# Patient Record
Sex: Male | Born: 1960 | Race: Black or African American | Hispanic: No | Marital: Single | Smoking: Current every day smoker
Health system: Southern US, Community
[De-identification: ages and names within clinical notes are randomized; demographics above are authoritative.]

## PROBLEM LIST (undated history)

## (undated) DIAGNOSIS — F209 Schizophrenia, unspecified: Secondary | ICD-10-CM

## (undated) DIAGNOSIS — F79 Unspecified intellectual disabilities: Secondary | ICD-10-CM

## (undated) DIAGNOSIS — I10 Essential (primary) hypertension: Secondary | ICD-10-CM

## (undated) DIAGNOSIS — E119 Type 2 diabetes mellitus without complications: Secondary | ICD-10-CM

## (undated) DIAGNOSIS — E785 Hyperlipidemia, unspecified: Secondary | ICD-10-CM

## (undated) HISTORY — PX: COLONOSCOPY: SHX174

---

## 1998-05-17 ENCOUNTER — Emergency Department (HOSPITAL_COMMUNITY): Admission: EM | Admit: 1998-05-17 | Discharge: 1998-05-17 | Payer: Self-pay | Admitting: Emergency Medicine

## 2008-11-08 ENCOUNTER — Emergency Department (HOSPITAL_COMMUNITY): Admission: EM | Admit: 2008-11-08 | Discharge: 2008-11-08 | Payer: Self-pay | Admitting: Emergency Medicine

## 2010-05-30 ENCOUNTER — Emergency Department (HOSPITAL_BASED_OUTPATIENT_CLINIC_OR_DEPARTMENT_OTHER)
Admission: EM | Admit: 2010-05-30 | Discharge: 2010-05-30 | Disposition: A | Discharge status: Home / Self Care | Payer: Medicare Other | Attending: Emergency Medicine | Admitting: Emergency Medicine

## 2010-05-30 ENCOUNTER — Emergency Department (INDEPENDENT_AMBULATORY_CARE_PROVIDER_SITE_OTHER): Payer: Medicare Other

## 2010-05-30 DIAGNOSIS — Z79899 Other long term (current) drug therapy: Secondary | ICD-10-CM | POA: Insufficient documentation

## 2010-05-30 DIAGNOSIS — F17200 Nicotine dependence, unspecified, uncomplicated: Secondary | ICD-10-CM | POA: Insufficient documentation

## 2010-05-30 DIAGNOSIS — I10 Essential (primary) hypertension: Secondary | ICD-10-CM | POA: Insufficient documentation

## 2010-05-30 DIAGNOSIS — W1809XA Striking against other object with subsequent fall, initial encounter: Secondary | ICD-10-CM | POA: Insufficient documentation

## 2010-05-30 DIAGNOSIS — S1093XA Contusion of unspecified part of neck, initial encounter: Secondary | ICD-10-CM

## 2010-05-30 DIAGNOSIS — IMO0002 Reserved for concepts with insufficient information to code with codable children: Secondary | ICD-10-CM

## 2010-05-30 DIAGNOSIS — E119 Type 2 diabetes mellitus without complications: Secondary | ICD-10-CM | POA: Insufficient documentation

## 2010-05-30 DIAGNOSIS — E785 Hyperlipidemia, unspecified: Secondary | ICD-10-CM | POA: Insufficient documentation

## 2010-05-30 DIAGNOSIS — W010XXA Fall on same level from slipping, tripping and stumbling without subsequent striking against object, initial encounter: Secondary | ICD-10-CM

## 2012-04-20 ENCOUNTER — Emergency Department (HOSPITAL_BASED_OUTPATIENT_CLINIC_OR_DEPARTMENT_OTHER)
Admission: EM | Admit: 2012-04-20 | Discharge: 2012-04-20 | Disposition: A | Discharge status: Home / Self Care | Payer: Medicare Other | Attending: Emergency Medicine | Admitting: Emergency Medicine

## 2012-04-20 ENCOUNTER — Emergency Department (HOSPITAL_BASED_OUTPATIENT_CLINIC_OR_DEPARTMENT_OTHER): Payer: Medicare Other

## 2012-04-20 ENCOUNTER — Encounter (HOSPITAL_BASED_OUTPATIENT_CLINIC_OR_DEPARTMENT_OTHER): Payer: Self-pay

## 2012-04-20 DIAGNOSIS — F79 Unspecified intellectual disabilities: Secondary | ICD-10-CM | POA: Insufficient documentation

## 2012-04-20 DIAGNOSIS — R1013 Epigastric pain: Secondary | ICD-10-CM | POA: Insufficient documentation

## 2012-04-20 DIAGNOSIS — R109 Unspecified abdominal pain: Secondary | ICD-10-CM

## 2012-04-20 DIAGNOSIS — Z8659 Personal history of other mental and behavioral disorders: Secondary | ICD-10-CM | POA: Insufficient documentation

## 2012-04-20 DIAGNOSIS — F17200 Nicotine dependence, unspecified, uncomplicated: Secondary | ICD-10-CM | POA: Insufficient documentation

## 2012-04-20 HISTORY — DX: Schizophrenia, unspecified: F20.9

## 2012-04-20 HISTORY — DX: Unspecified intellectual disabilities: F79

## 2012-04-20 LAB — LIPASE, BLOOD: Lipase: 12 U/L (ref 11–59)

## 2012-04-20 LAB — COMPREHENSIVE METABOLIC PANEL
ALT: 15 U/L (ref 0–53)
AST: 15 U/L (ref 0–37)
Albumin: 3.8 g/dL (ref 3.5–5.2)
Alkaline Phosphatase: 81 U/L (ref 39–117)
Chloride: 99 mEq/L (ref 96–112)
Potassium: 4 mEq/L (ref 3.5–5.1)
Sodium: 140 mEq/L (ref 135–145)
Total Bilirubin: 0.2 mg/dL — ABNORMAL LOW (ref 0.3–1.2)
Total Protein: 7.1 g/dL (ref 6.0–8.3)

## 2012-04-20 LAB — CBC WITH DIFFERENTIAL/PLATELET
Basophils Relative: 0 % (ref 0–1)
Eosinophils Absolute: 0 10*3/uL (ref 0.0–0.7)
Hemoglobin: 12.7 g/dL — ABNORMAL LOW (ref 13.0–17.0)
MCH: 29.1 pg (ref 26.0–34.0)
MCHC: 34 g/dL (ref 30.0–36.0)
Neutro Abs: 2.4 10*3/uL (ref 1.7–7.7)
Neutrophils Relative %: 53 % (ref 43–77)
Platelets: 181 10*3/uL (ref 150–400)
RBC: 4.37 MIL/uL (ref 4.22–5.81)

## 2012-04-20 LAB — URINALYSIS, ROUTINE W REFLEX MICROSCOPIC
Bilirubin Urine: NEGATIVE
Ketones, ur: NEGATIVE mg/dL
Leukocytes, UA: NEGATIVE
Nitrite: NEGATIVE
Specific Gravity, Urine: 1.022 (ref 1.005–1.030)
Urobilinogen, UA: 0.2 mg/dL (ref 0.0–1.0)
pH: 5.5 (ref 5.0–8.0)

## 2012-04-20 NOTE — ED Notes (Signed)
Pt states that he has had abdominal pain for the past month, denies nausea/vomiting/constipation/diarrhea, denies heartburn.  Pt presents with caregiver who states that pt has hx of MR and schizophrenia.

## 2012-04-20 NOTE — ED Provider Notes (Signed)
History     CSN: [id number]  Arrival date & time 04/20/12  9147   First MD Initiated Contact with Patient 04/20/12 0830      Chief Complaint  Patient presents with  . Abdominal Pain  level 5 patient with mr and history from patient and caregiver.   (Consider location/radiation/quality/duration/timing/severity/associated sxs/prior treatment) HPI 52 y.o. With epigastric pain for a month.  Sometimes worsens with food, but present constantly.  Sometimes worsens with soda.  No nausea, vomiting, diarrhea, eating usual diet, no weight loss or gain.  Patient with mr and here with caregiver. No history of abdominal surgery.  Pain is mild presently.   Past Medical History  Diagnosis Date  . MR (mental retardation)   . Schizophrenia     Past Surgical History  Procedure Date  . Colonoscopy     History reviewed. No pertinent family history.  History  Substance Use Topics  . Smoking status: Current Every Day Smoker -- 1.0 packs/day    Types: Cigarettes  . Smokeless tobacco: Never Used  . Alcohol Use: Yes     Comment: occasional use      Review of Systems  Constitutional: Negative.   HENT: Negative.   Eyes: Negative.   Respiratory: Negative.   Cardiovascular: Negative.   Gastrointestinal: Positive for abdominal pain. Negative for nausea, vomiting, diarrhea, constipation, blood in stool, abdominal distention, anal bleeding and rectal pain.  Genitourinary: Negative.   Musculoskeletal: Negative.   Neurological: Negative.   Psychiatric/Behavioral: Negative.     Allergies  Review of patient's allergies indicates no known allergies.  Home Medications  No current outpatient prescriptions on file.  BP 121/76  Pulse 89  Temp 97.6 F (36.4 C) (Oral)  Resp 17  Ht 6\' 1"  (1.854 m)  Wt 200 lb (90.719 kg)  BMI 26.39 kg/m2  SpO2 98%  Physical Exam  Nursing note and vitals reviewed. Constitutional: He is oriented to person, place, and time. He appears well-developed and  well-nourished.  HENT:  Head: Normocephalic and atraumatic.  Eyes: Conjunctivae normal and EOM are normal. Pupils are equal, round, and reactive to light.  Neck: Normal range of motion.  Cardiovascular: Normal rate, regular rhythm and normal heart sounds.   Pulmonary/Chest: Effort normal and breath sounds normal.  Abdominal: Soft. Bowel sounds are normal. He exhibits no distension and no mass. There is no tenderness. There is no rebound and no guarding.  Genitourinary: Penis normal.  Musculoskeletal: Normal range of motion.  Neurological: He is alert and oriented to person, place, and time. He has normal reflexes.  Skin: Skin is warm and dry.  Psychiatric: He has a normal mood and affect.    ED Course  Procedures (including critical care time)   Labs Reviewed  URINALYSIS, ROUTINE W REFLEX MICROSCOPIC   No results found.   No diagnosis found.    No acute abnormalities found on labs, x-Tavonna Worthington, or pe.  Patient and caregiver advised close follow up with pmd this week and to return if worse especially worsening pain, vomiting, fever.        Hilario Quarry, MD 04/20/12 1010

## 2012-04-20 NOTE — ED Notes (Signed)
Family Member out in the hall for the third time. Informed pt results are back that MD has to review them herself and she will be in shortly. MD is currently with another pt. Asked family to remain in room.

## 2012-06-12 ENCOUNTER — Encounter (HOSPITAL_BASED_OUTPATIENT_CLINIC_OR_DEPARTMENT_OTHER): Payer: Self-pay

## 2012-06-12 ENCOUNTER — Emergency Department (HOSPITAL_BASED_OUTPATIENT_CLINIC_OR_DEPARTMENT_OTHER): Payer: Medicare Other

## 2012-06-12 ENCOUNTER — Emergency Department (HOSPITAL_BASED_OUTPATIENT_CLINIC_OR_DEPARTMENT_OTHER)
Admission: EM | Admit: 2012-06-12 | Discharge: 2012-06-12 | Disposition: A | Discharge status: Other Acute Inpatient Hospital | Payer: Medicare Other | Attending: Emergency Medicine | Admitting: Emergency Medicine

## 2012-06-12 DIAGNOSIS — Z79899 Other long term (current) drug therapy: Secondary | ICD-10-CM | POA: Insufficient documentation

## 2012-06-12 DIAGNOSIS — R29810 Facial weakness: Secondary | ICD-10-CM

## 2012-06-12 DIAGNOSIS — R5383 Other fatigue: Secondary | ICD-10-CM | POA: Insufficient documentation

## 2012-06-12 DIAGNOSIS — R209 Unspecified disturbances of skin sensation: Secondary | ICD-10-CM | POA: Insufficient documentation

## 2012-06-12 DIAGNOSIS — F17200 Nicotine dependence, unspecified, uncomplicated: Secondary | ICD-10-CM | POA: Insufficient documentation

## 2012-06-12 DIAGNOSIS — E785 Hyperlipidemia, unspecified: Secondary | ICD-10-CM | POA: Insufficient documentation

## 2012-06-12 DIAGNOSIS — R5381 Other malaise: Secondary | ICD-10-CM | POA: Insufficient documentation

## 2012-06-12 DIAGNOSIS — Z7982 Long term (current) use of aspirin: Secondary | ICD-10-CM | POA: Insufficient documentation

## 2012-06-12 DIAGNOSIS — E119 Type 2 diabetes mellitus without complications: Secondary | ICD-10-CM | POA: Insufficient documentation

## 2012-06-12 DIAGNOSIS — I10 Essential (primary) hypertension: Secondary | ICD-10-CM | POA: Insufficient documentation

## 2012-06-12 DIAGNOSIS — F209 Schizophrenia, unspecified: Secondary | ICD-10-CM | POA: Insufficient documentation

## 2012-06-12 DIAGNOSIS — F79 Unspecified intellectual disabilities: Secondary | ICD-10-CM | POA: Insufficient documentation

## 2012-06-12 HISTORY — DX: Type 2 diabetes mellitus without complications: E11.9

## 2012-06-12 HISTORY — DX: Hyperlipidemia, unspecified: E78.5

## 2012-06-12 HISTORY — DX: Essential (primary) hypertension: I10

## 2012-06-12 LAB — COMPREHENSIVE METABOLIC PANEL
ALT: 15 U/L (ref 0–53)
BUN: 17 mg/dL (ref 6–23)
CO2: 26 mEq/L (ref 19–32)
Calcium: 9.7 mg/dL (ref 8.4–10.5)
Creatinine, Ser: 1.2 mg/dL (ref 0.50–1.35)
GFR calc Af Amer: 79 mL/min — ABNORMAL LOW (ref >90)
GFR calc non Af Amer: 68 mL/min — ABNORMAL LOW (ref >90)
Glucose, Bld: 90 mg/dL (ref 70–99)
Sodium: 139 mEq/L (ref 135–145)
Total Protein: 7.6 g/dL (ref 6.0–8.3)

## 2012-06-12 LAB — RAPID URINE DRUG SCREEN, HOSP PERFORMED
Amphetamines: NOT DETECTED
Benzodiazepines: NOT DETECTED
Opiates: NOT DETECTED

## 2012-06-12 LAB — TROPONIN I: Troponin I: <0.3 ng/mL (ref <0.30)

## 2012-06-12 LAB — DIFFERENTIAL
Eosinophils Absolute: 0 10*3/uL (ref 0.0–0.7)
Eosinophils Relative: 0 % (ref 0–5)
Lymphocytes Relative: 26 % (ref 12–46)
Lymphs Abs: 1.4 10*3/uL (ref 0.7–4.0)
Monocytes Absolute: 0.6 10*3/uL (ref 0.1–1.0)
Monocytes Relative: 11 % (ref 3–12)

## 2012-06-12 LAB — CBC
HCT: 37.9 % — ABNORMAL LOW (ref 39.0–52.0)
Hemoglobin: 13.2 g/dL (ref 13.0–17.0)
MCH: 29.3 pg (ref 26.0–34.0)
MCV: 84.2 fL (ref 78.0–100.0)
Platelets: 213 10*3/uL (ref 150–400)
RBC: 4.5 MIL/uL (ref 4.22–5.81)
WBC: 5.2 10*3/uL (ref 4.0–10.5)

## 2012-06-12 LAB — PROTIME-INR: Prothrombin Time: 12.5 seconds (ref 11.6–15.2)

## 2012-06-12 LAB — URINALYSIS, ROUTINE W REFLEX MICROSCOPIC
Glucose, UA: NEGATIVE mg/dL
Hgb urine dipstick: NEGATIVE
Ketones, ur: NEGATIVE mg/dL
Leukocytes, UA: NEGATIVE
pH: 5 (ref 5.0–8.0)

## 2012-06-12 NOTE — ED Provider Notes (Signed)
History     CSN: [id number]  Arrival date & time 06/12/12  1047   First MD Initiated Contact with Patient 06/12/12 1107      Chief Complaint  Patient presents with  . Facial Droop  . Eye Problem    (Consider location/radiation/quality/duration/timing/severity/associated sxs/prior treatment) HPI Comments: Patient with history of mental retardation presenting with right-sided facial droop, vision change since waking up this morning. Caregiver states she was fine when he went to bed last night. Symptoms are noticed this morning when he woke up at 6:30. Patient received a pneumonia shot and his PCP yesterday. He has apparently been having problems with his vision in his right eye for quite some time. Denies chest pain, shortness of breath. Denies difficulty breathing, swallowing or speaking. Denies any weakness in arms or legs. History of diabetes, hypertension, tobacco abuse  The history is provided by the patient and a caregiver.    Past Medical History  Diagnosis Date  . MR (mental retardation)   . Schizophrenia   . Diabetes mellitus without complication   . Hypertension   . Hyperlipemia     Past Surgical History  Procedure Laterality Date  . Colonoscopy      No family history on file.  History  Substance Use Topics  . Smoking status: Current Every Day Smoker -- 1.00 packs/day    Types: Cigarettes  . Smokeless tobacco: Never Used  . Alcohol Use: Yes     Comment: occasional use      Review of Systems  Constitutional: Negative for fever and activity change.  HENT: Negative for congestion and rhinorrhea.   Eyes: Positive for visual disturbance. Negative for pain.  Respiratory: Negative for cough, chest tightness and shortness of breath.   Cardiovascular: Negative for chest pain.  Gastrointestinal: Negative for nausea and abdominal pain.  Genitourinary: Negative for dysuria and testicular pain.  Musculoskeletal: Negative for back pain.  Skin: Negative for rash.   Neurological: Positive for facial asymmetry, weakness and numbness. Negative for dizziness and headaches.  A complete 10 system review of systems was obtained and all systems are negative except as noted in the HPI and PMH.    Allergies  Review of patient's allergies indicates no known allergies.  Home Medications   Current Outpatient Rx  Name  Route  Sig  Dispense  Refill  . aspirin 81 MG tablet   Oral   Take 81 mg by mouth daily.         . carbamazepine (TEGRETOL) 200 MG tablet   Oral   Take 200 mg by mouth daily.         . divalproex (DEPAKOTE) 500 MG DR tablet   Oral   Take 500 mg by mouth daily.         . hydrochlorothiazide (HYDRODIURIL) 25 MG tablet   Oral   Take 25 mg by mouth daily.         Marland Kitchen lisinopril (PRINIVIL,ZESTRIL) 20 MG tablet   Oral   Take 20 mg by mouth daily.         . metFORMIN (GLUCOPHAGE) 500 MG tablet   Oral   Take 500 mg by mouth daily.         Marland Kitchen OLANZapine (ZYPREXA) 15 MG tablet   Oral   Take 15 mg by mouth at bedtime.         . pravastatin (PRAVACHOL) 80 MG tablet   Oral   Take 80 mg by mouth daily.         Marland Kitchen  QUEtiapine (SEROQUEL) 400 MG tablet   Oral   Take 400 mg by mouth at bedtime.         . verapamil (COVERA HS) 240 MG (CO) 24 hr tablet   Oral   Take 240 mg by mouth at bedtime.           BP 124/93  Pulse 86  Temp(Src) 97.9 F (36.6 C) (Oral)  Resp 16  Ht 6\' 1"  (1.854 m)  Wt 200 lb (90.719 kg)  BMI 26.39 kg/m2  SpO2 100%  Physical Exam  Constitutional: He is oriented to person, place, and time. He appears well-developed and well-nourished. No distress.  HENT:  Head: Normocephalic and atraumatic.  Mouth/Throat: Oropharynx is clear and moist. No oropharyngeal exudate.  Eyes: Conjunctivae and EOM are normal. Pupils are equal, round, and reactive to light.  Neck: Normal range of motion. Neck supple.  Cardiovascular: Normal rate, regular rhythm and normal heart sounds.   No murmur  heard. Pulmonary/Chest: Effort normal and breath sounds normal. No respiratory distress.  Abdominal: Soft. There is no tenderness. There is no rebound and no guarding.  Musculoskeletal: Normal range of motion. He exhibits no edema.  Neurological: He is alert and oriented to person, place, and time. A cranial nerve deficit is present. Coordination normal.  Right-sided facial droop with nasolabial fold flattening. Right eye ptosis. Forehead spared able to wrinkle forehead and raise right eyebrow. Questionable decreased grip strength in right upper extremity. No pronator drift. 5 out of 5 strength in the lower charities. No ataxia finger to nose  Skin: Skin is warm.    ED Course  Procedures (including critical care time)  Labs Reviewed  CBC - Abnormal; Notable for the following:    HCT 37.9 (*)    All other components within normal limits  COMPREHENSIVE METABOLIC PANEL - Abnormal; Notable for the following:    Total Bilirubin 0.2 (*)    GFR calc non Af Amer 68 (*)    GFR calc Af Amer 79 (*)    All other components within normal limits  ETHANOL  PROTIME-INR  APTT  DIFFERENTIAL  URINE RAPID DRUG SCREEN (HOSP PERFORMED)  URINALYSIS, ROUTINE W REFLEX MICROSCOPIC  TROPONIN I  GLUCOSE, CAPILLARY   Ct Head Wo Contrast  06/12/2012  *RADIOLOGY REPORT*  Clinical Data: Right-sided facial droop  CT HEAD WITHOUT CONTRAST  Technique:  Contiguous axial images were obtained from the base of the skull through the vertex without contrast.  Comparison: None.  Findings: The ventricles are normal.  No extra-axial fluid collections are seen.  The brainstem and cerebellum are unremarkable.  No acute intracranial findings such as infarction or hemorrhage.  No mass lesions.  The bony calvarium is intact.  The visualized paranasal sinuses and mastoid air cells are clear.  IMPRESSION: No acute intracranial findings or mass lesion.   Original Report Authenticated By: Rudie Meyer, M.D.      1. Facial droop        MDM  Right sided facial droop vision changes started this morning. Last seen normal last night. Patient not TPA candidate secondary to delayed presentation.  Bell's palsy is possible though patient is able to wrinkle forehead.  CT head negative. The patient with persistent right nasolabial fold flattening, facial droop. 4 head is spared.  Cannot rule out CVA versus TIA. MRI needed. Patient prefers admission at [facility]. Discussed with Dr. Darleene Cleaver.   Date: 06/12/2012  Rate: 88  Rhythm: normal sinus rhythm  QRS Axis: normal  Intervals: normal  ST/T Wave abnormalities: normal  Conduction Disutrbances:none  Narrative Interpretation:   Old EKG Reviewed: none available          Glynn Octave, MD 06/12/12 1342

## 2012-06-12 NOTE — ED Notes (Signed)
MD at bedside. 

## 2012-06-12 NOTE — ED Notes (Signed)
Called regional for admission to [facility]

## 2012-06-12 NOTE — ED Notes (Signed)
[facility] is coming to transport patient to [place]--Room 600

## 2012-06-12 NOTE — ED Notes (Signed)
[facility] at bedside preparing for transport. 

## 2012-06-12 NOTE — ED Notes (Signed)
Urinal provided.

## 2012-06-12 NOTE — ED Notes (Signed)
Pt returned from CT, no change in condition.

## 2012-06-12 NOTE — ED Notes (Signed)
Pt reports right sided facial droop, right eyelid swelling and visual changes in right eye.  Onset of symptoms this am at 0630.

## 2014-03-27 IMAGING — CT CT HEAD W/O CM
1 series · 16 of 30 positions shown, 20 images · non-contrast
Comparison: None.

CLINICAL DATA: Right-sided facial droop

CT HEAD WITHOUT CONTRAST
TECHNIQUE: Contiguous axial images were obtained from the base of
the skull through the vertex without contrast.

[Series 2: head 4.8 h37s · axial · 0.41mm/px · z∈[+1095,+1228]mm · 16 of 32 slices shown, 20 images]
[im 2/32  brain]
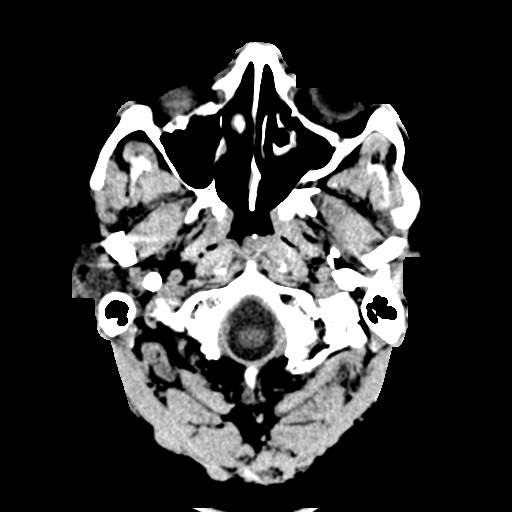
[im 2/32  bone]
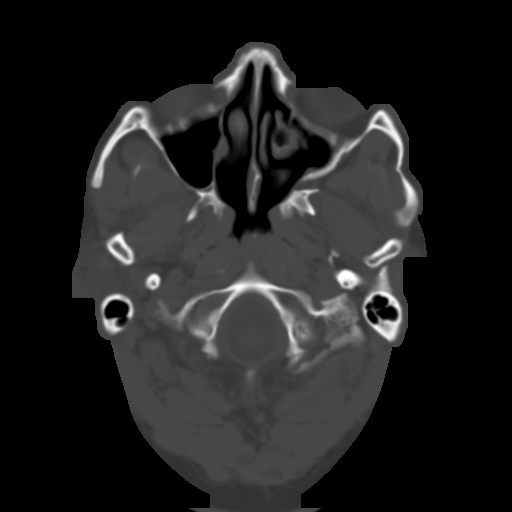
[im 4/32  brain]
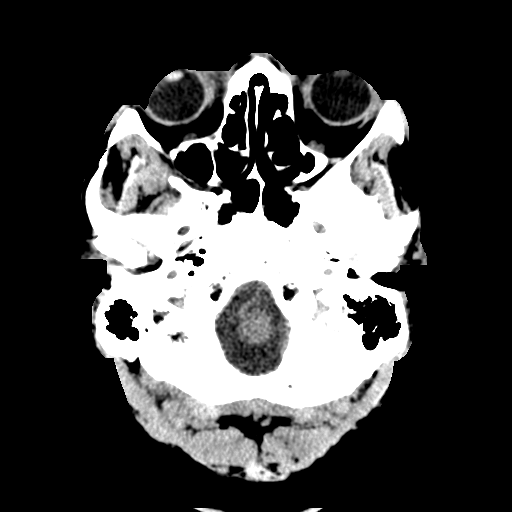
[im 6/32  brain]
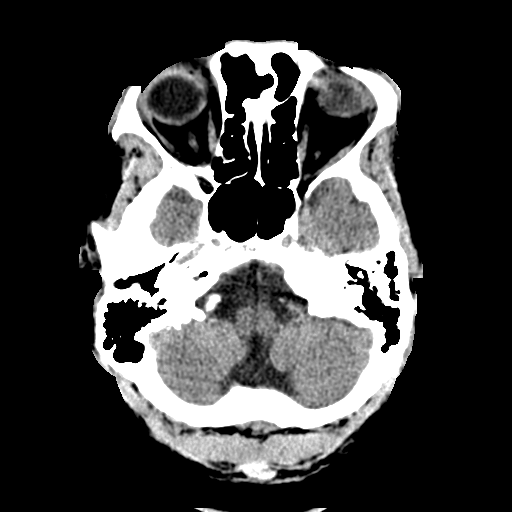
[im 8/32  brain]
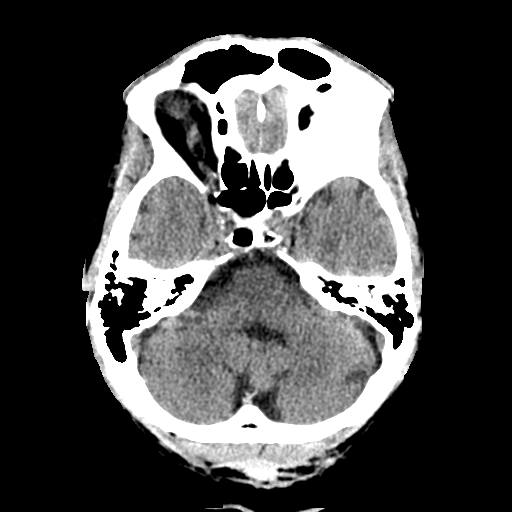
[im 9/32  brain]
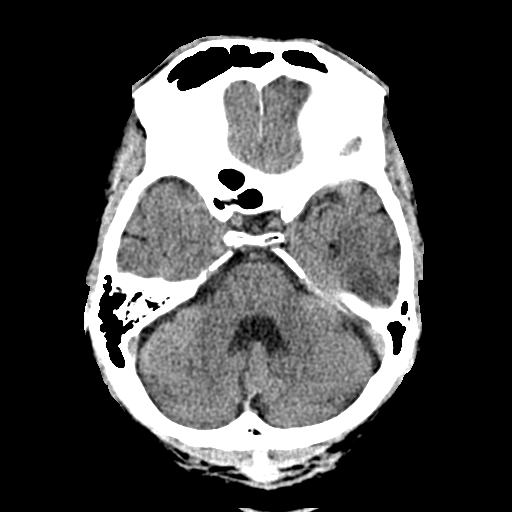
[im 9/32  bone]
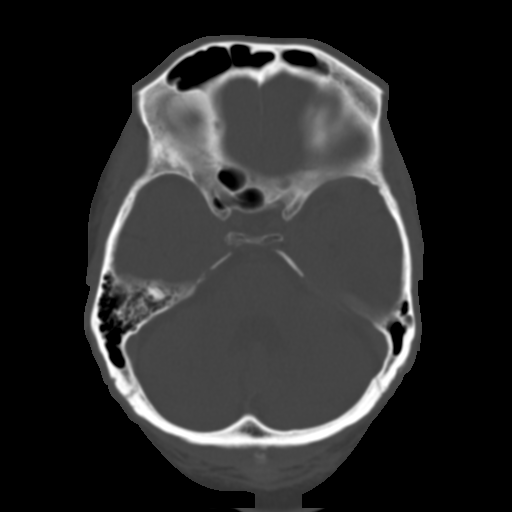
[im 11/32  brain]
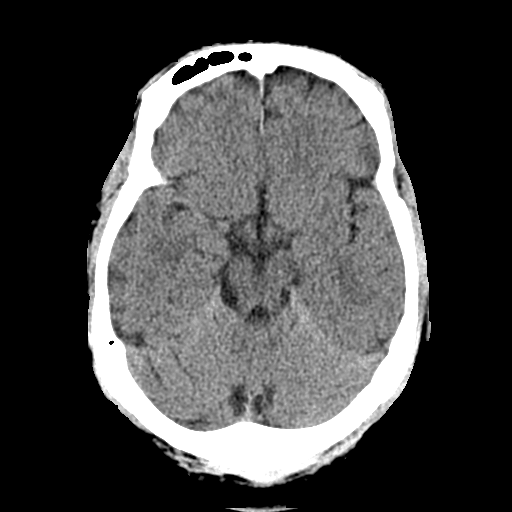
[im 13/32  brain]
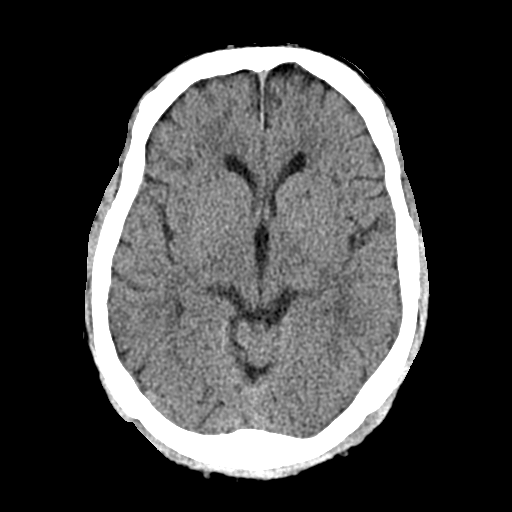
[im 15/32  brain]
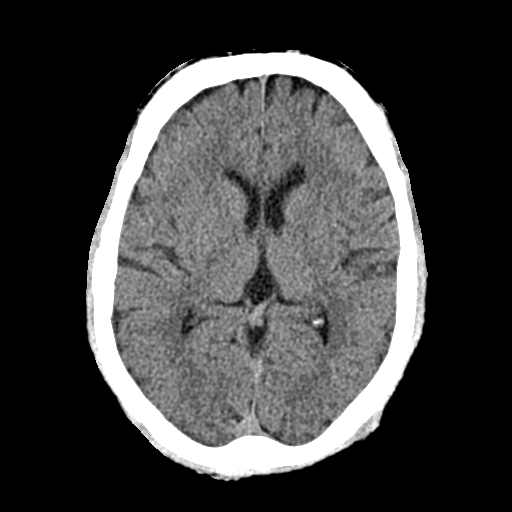
[im 17/32  brain]
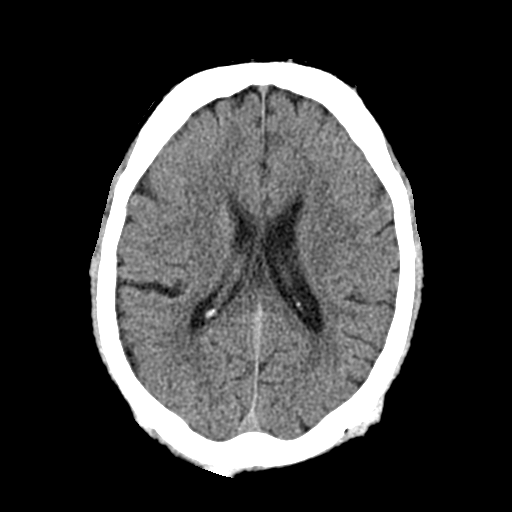
[im 17/32  bone]
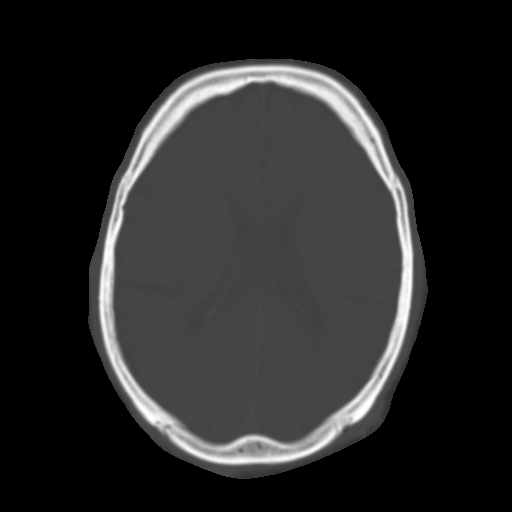
[im 19/32  brain]
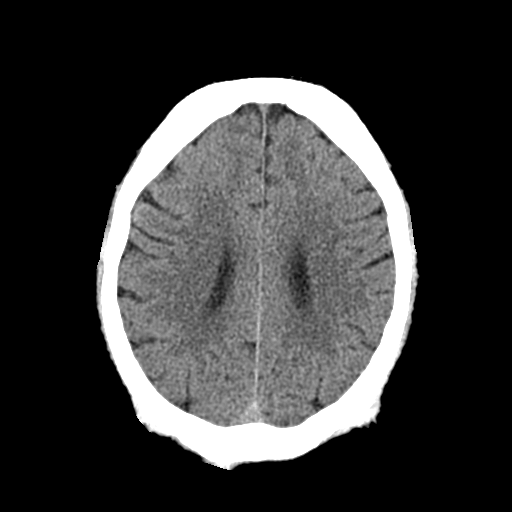
[im 21/32  brain]
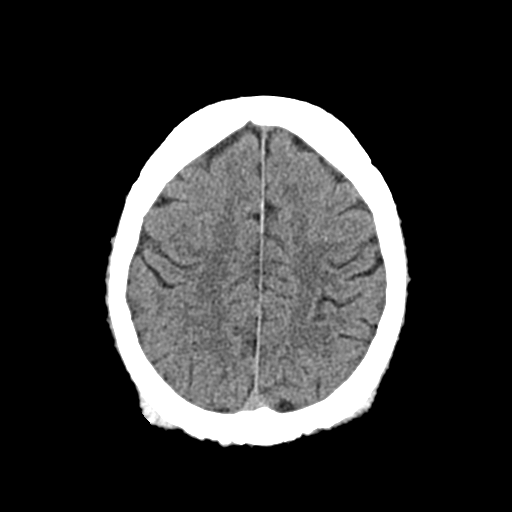
[im 23/32  brain]
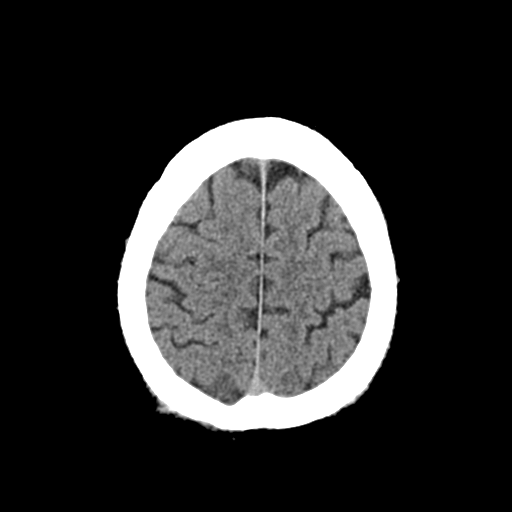
[im 24/32  brain]
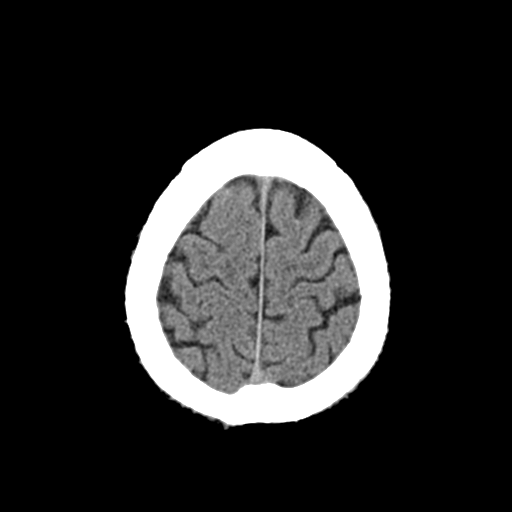
[im 24/32  bone]
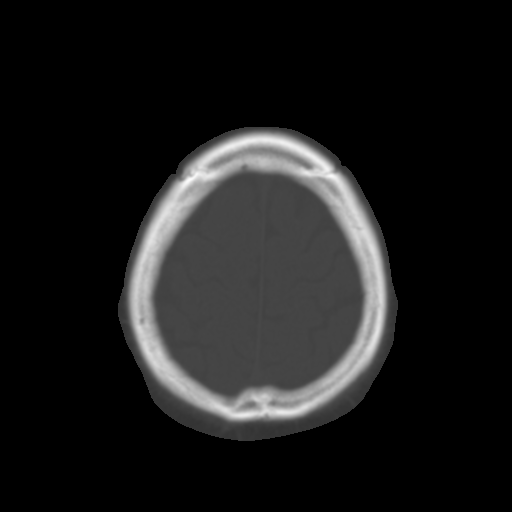
[im 26/32  brain]
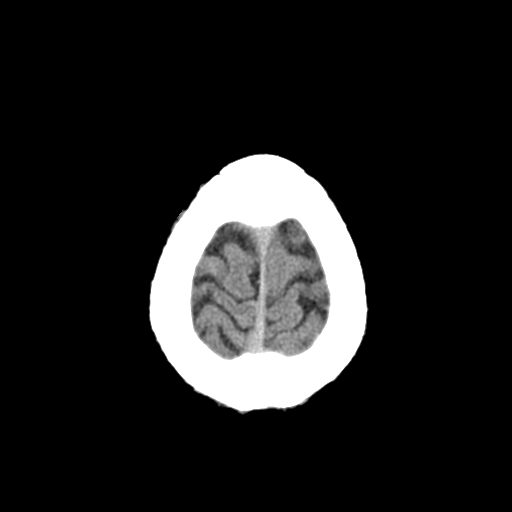
[im 28/32  brain]
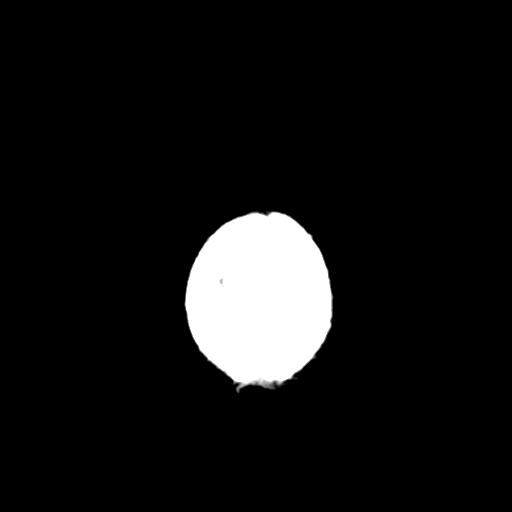
[im 30/32  brain]
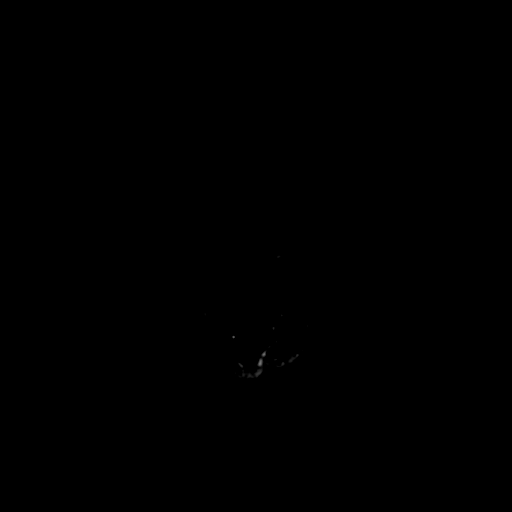

[16 of 30 positions shown; findings below may reference images not displayed]

FINDINGS: The ventricles are normal.  No extra-axial fluid
collections are seen.  The brainstem and cerebellum are
unremarkable.  No acute intracranial findings such as infarction or
hemorrhage.  No mass lesions.

The bony calvarium is intact.  The visualized paranasal sinuses and
mastoid air cells are clear.
IMPRESSION: No acute intracranial findings or mass lesion.

## 2014-08-16 ENCOUNTER — Other Ambulatory Visit: Payer: Self-pay | Admitting: Family Medicine

## 2014-12-17 ENCOUNTER — Other Ambulatory Visit: Payer: Self-pay | Admitting: Family Medicine

## 2015-08-13 ENCOUNTER — Emergency Department (HOSPITAL_BASED_OUTPATIENT_CLINIC_OR_DEPARTMENT_OTHER)
Admission: EM | Admit: 2015-08-13 | Discharge: 2015-08-13 | Disposition: A | Discharge status: Home / Self Care | Payer: Medicare Other | Attending: Emergency Medicine | Admitting: Emergency Medicine

## 2015-08-13 ENCOUNTER — Encounter (HOSPITAL_BASED_OUTPATIENT_CLINIC_OR_DEPARTMENT_OTHER): Payer: Self-pay | Admitting: *Deleted

## 2015-08-13 ENCOUNTER — Emergency Department (HOSPITAL_BASED_OUTPATIENT_CLINIC_OR_DEPARTMENT_OTHER): Payer: Medicare Other

## 2015-08-13 DIAGNOSIS — E785 Hyperlipidemia, unspecified: Secondary | ICD-10-CM | POA: Diagnosis not present

## 2015-08-13 DIAGNOSIS — R109 Unspecified abdominal pain: Secondary | ICD-10-CM | POA: Diagnosis not present

## 2015-08-13 DIAGNOSIS — Z7984 Long term (current) use of oral hypoglycemic drugs: Secondary | ICD-10-CM | POA: Insufficient documentation

## 2015-08-13 DIAGNOSIS — E119 Type 2 diabetes mellitus without complications: Secondary | ICD-10-CM | POA: Insufficient documentation

## 2015-08-13 DIAGNOSIS — Z79899 Other long term (current) drug therapy: Secondary | ICD-10-CM | POA: Insufficient documentation

## 2015-08-13 DIAGNOSIS — F17210 Nicotine dependence, cigarettes, uncomplicated: Secondary | ICD-10-CM | POA: Diagnosis not present

## 2015-08-13 DIAGNOSIS — I10 Essential (primary) hypertension: Secondary | ICD-10-CM | POA: Diagnosis not present

## 2015-08-13 DIAGNOSIS — F209 Schizophrenia, unspecified: Secondary | ICD-10-CM | POA: Insufficient documentation

## 2015-08-13 LAB — CBC WITH DIFFERENTIAL/PLATELET
Basophils Absolute: 0 10*3/uL (ref 0.0–0.1)
Basophils Relative: 0 %
Eosinophils Absolute: 0.1 10*3/uL (ref 0.0–0.7)
Eosinophils Relative: 1 %
HCT: 39 % (ref 39.0–52.0)
Hemoglobin: 13.5 g/dL (ref 13.0–17.0)
LYMPHS PCT: 32 %
Lymphs Abs: 2 10*3/uL (ref 0.7–4.0)
MCH: 29.6 pg (ref 26.0–34.0)
MCHC: 34.6 g/dL (ref 30.0–36.0)
MCV: 85.5 fL (ref 78.0–100.0)
Monocytes Absolute: 0.5 10*3/uL (ref 0.1–1.0)
Monocytes Relative: 8 %
NEUTROS ABS: 3.7 10*3/uL (ref 1.7–7.7)
NEUTROS PCT: 59 %
PLATELETS: 190 10*3/uL (ref 150–400)
RBC: 4.56 MIL/uL (ref 4.22–5.81)
RDW: 13.6 % (ref 11.5–15.5)
WBC: 6.3 10*3/uL (ref 4.0–10.5)

## 2015-08-13 LAB — COMPREHENSIVE METABOLIC PANEL
ALK PHOS: 82 U/L (ref 38–126)
ALT: 24 U/L (ref 17–63)
ANION GAP: 9 (ref 5–15)
AST: 24 U/L (ref 15–41)
Albumin: 3.9 g/dL (ref 3.5–5.0)
BILIRUBIN TOTAL: 0.6 mg/dL (ref 0.3–1.2)
BUN: 15 mg/dL (ref 6–20)
CALCIUM: 8.9 mg/dL (ref 8.9–10.3)
CO2: 26 mmol/L (ref 22–32)
Chloride: 103 mmol/L (ref 101–111)
Creatinine, Ser: 1.11 mg/dL (ref 0.61–1.24)
GFR calc non Af Amer: >60 mL/min (ref >60)
Glucose, Bld: 116 mg/dL — ABNORMAL HIGH (ref 65–99)
POTASSIUM: 3.5 mmol/L (ref 3.5–5.1)
SODIUM: 138 mmol/L (ref 135–145)
TOTAL PROTEIN: 7.2 g/dL (ref 6.5–8.1)

## 2015-08-13 LAB — URINALYSIS, ROUTINE W REFLEX MICROSCOPIC
BILIRUBIN URINE: NEGATIVE
Glucose, UA: NEGATIVE mg/dL
HGB URINE DIPSTICK: NEGATIVE
KETONES UR: 15 mg/dL — AB
Leukocytes, UA: NEGATIVE
NITRITE: NEGATIVE
Protein, ur: NEGATIVE mg/dL
SPECIFIC GRAVITY, URINE: 1.017 (ref 1.005–1.030)
pH: 5.5 (ref 5.0–8.0)

## 2015-08-13 NOTE — ED Notes (Signed)
MD at bedside. 

## 2015-08-13 NOTE — ED Notes (Signed)
Pt has a caregiver, Diane Peguese, at bedside. States she's part of a community support services government program and is a caregiver for the pt d/t pt's mental illness. Reports R flank pain x182months; hx of UTIs. Reports increase in frequency; denies hematuria, dysuria, fever.

## 2015-08-13 NOTE — ED Notes (Signed)
Caregiver reports pt currently taking Levaquin, prescribed 5 days ago, d/t potential UTI.

## 2015-08-13 NOTE — ED Provider Notes (Signed)
CSN: [id number]     Arrival date & time 08/13/15  1024 History   First MD Initiated Contact with Patient 08/13/15 1043     Chief Complaint  Patient presents with  . Flank Pain   LEVEL 5 CAVEAT - MENTAL RETARDATION  (Consider location/radiation/quality/duration/timing/severity/associated sxs/prior Treatment) HPI  55 year old male with a history of mental retardation, schizophrenia, and diabetes presents with right flank pain. Caregiver provides the history. Patient has been having constant pain for about 2 months. Has been having frequent urination for 6 months. Unclear if he is having dysuria. No reported fevers or vomiting. No complaints of abdominal pain. No known injury. Patient saw his PCP earlier this week and was placed on meloxicam and Levaquin for a UTI. Patient's pain is still present and so the caregiver is concerned about other pathology such as appendicitis. No reported cough or shortness of breath.  Past Medical History  Diagnosis Date  . MR (mental retardation)   . Schizophrenia (HCC)   . Diabetes mellitus without complication (HCC)   . Hypertension   . Hyperlipemia    Past Surgical History  Procedure Laterality Date  . Colonoscopy     No family history on file. Social History  Substance Use Topics  . Smoking status: Current Every Day Smoker -- 1.50 packs/day    Types: Cigarettes  . Smokeless tobacco: Never Used  . Alcohol Use: 1.2 oz/week    2 Cans of beer per week    Review of Systems  Unable to perform ROS: Other      Allergies  Review of patient's allergies indicates no known allergies.  Home Medications   Prior to Admission medications   Medication Sig Start Date End Date Taking? Authorizing Provider  aspirin 81 MG tablet Take 81 mg by mouth daily.   Yes Historical Provider, MD  atorvastatin (LIPITOR) 40 MG tablet Take 40 mg by mouth daily.   Yes Historical Provider, MD  carbamazepine (TEGRETOL) 200 MG tablet Take 200 mg by mouth daily.   Yes  Historical Provider, MD  divalproex (DEPAKOTE) 500 MG DR tablet Take 500 mg by mouth daily.   Yes Historical Provider, MD  glipiZIDE (GLUCOTROL) 5 MG tablet Take 5 mg by mouth daily before breakfast.   Yes Historical Provider, MD  hydrochlorothiazide (HYDRODIURIL) 25 MG tablet Take 25 mg by mouth daily.   Yes Historical Provider, MD  lisinopril (PRINIVIL,ZESTRIL) 20 MG tablet Take 20 mg by mouth daily.   Yes Historical Provider, MD  meloxicam (MOBIC) 15 MG tablet Take 15 mg by mouth daily.   Yes Historical Provider, MD  metFORMIN (GLUCOPHAGE) 500 MG tablet Take 500 mg by mouth daily.   Yes Historical Provider, MD  OLANZapine (ZYPREXA) 15 MG tablet Take 15 mg by mouth at bedtime.   Yes Historical Provider, MD  QUEtiapine (SEROQUEL) 400 MG tablet Take 400 mg by mouth at bedtime.   Yes Historical Provider, MD   BP 127/89 mmHg  Pulse 79  Temp(Src) 98.3 F (36.8 C) (Oral)  Resp 20  Ht  (1.854 m)  Wt 220 lb (99.791 kg)  BMI 29.03 kg/m2  SpO2 96% Physical Exam  Constitutional: He is oriented to person, place, and time. He appears well-developed and well-nourished. No distress.  HENT:  Head: Normocephalic and atraumatic.  Right Ear: External ear normal.  Left Ear: External ear normal.  Nose: Nose normal.  Eyes: Right eye exhibits no discharge. Left eye exhibits no discharge.  Neck: Neck supple.  Cardiovascular: Normal rate, regular rhythm,  normal heart sounds and intact distal pulses.   Pulmonary/Chest: Effort normal and breath sounds normal.  Abdominal: Soft. He exhibits no distension. There is no tenderness.  Musculoskeletal: He exhibits no edema.       Thoracic back: He exhibits tenderness (mild). He exhibits no bony tenderness.       Back:  Neurological: He is alert and oriented to person, place, and time.  Skin: Skin is warm and dry. No rash noted. He is not diaphoretic.  Nursing note and vitals reviewed.   ED Course  Procedures (including critical care time) Labs  Review Labs Reviewed  URINALYSIS, ROUTINE W REFLEX MICROSCOPIC ([facility]) - Abnormal; Notable for the following:    Ketones, ur 15 (*)    All other components within normal limits  COMPREHENSIVE METABOLIC PANEL - Abnormal; Notable for the following:    Glucose, Bld 116 (*)    All other components within normal limits  CBC WITH DIFFERENTIAL/PLATELET    Imaging Review Ct Renal Stone Study  08/13/2015  CLINICAL DATA:  Right flank pain x 2 months, on antibiotics for UTI, increase in urinary frequency, denies hematuria or dysuria EXAM: CT ABDOMEN AND PELVIS WITHOUT CONTRAST TECHNIQUE: Multidetector CT imaging of the abdomen and pelvis was performed following the standard protocol without IV contrast. COMPARISON:  None. FINDINGS: Lung bases: Minor left lower lobe subsegmental atelectasis. Otherwise clear. Heart normal in size. Trace pericardial fluid. Hepatobiliary: 5 mm low-density right liver lobe lesion likely a cyst. No other liver abnormality. Normal gallbladder. No bile duct dilation. Spleen, pancreas, adrenal glands:  Normal. Kidneys, ureters, bladder: Tiny nonobstructing stones in the left kidney. Possible tiny nonobstructing stone in the upper pole the right kidney. No renal masses. No hydronephrosis. Normal ureters. Normal bladder. Lymph nodes:  No adenopathy. Ascites:  None. Gastrointestinal: Stomach, small bowel and colon are unremarkable. Normal appendix visualized. Musculoskeletal: Mild endplate spurring at T12-L1 and at L5-S1. No osteoblastic or osteolytic lesions. IMPRESSION: 1. No acute findings within the abdomen pelvis. No findings to explain the patient's symptoms. 2. Tiny nonobstructing left intrarenal stones and possible tiny nonobstructing upper pole right intrarenal stone. No ureteral stone or obstructive uropathy. 3. Normal appendix visualized. Electronically Signed   By: Amie Portlandavid  Ormond M.D.   On: 08/13/2015 12:02   I have personally reviewed and evaluated these images and lab  results as part of my medical decision-making.   EKG Interpretation None      MDM   Final diagnoses:  Right flank pain    No signs of emergent pathology. Vital signs are normal and he appears quite comfortable. LFTs are benign. Minimal tenderness. This point I have low suspicion of acute emergent pathology and given benign workup think he can be discharged home to follow-up with PCP. Continue NSAIDs and Tylenol as needed. Discussed return precautions.    Pricilla LovelessScott Kenon Delashmit, MD 08/13/15 1236

## 2017-05-27 IMAGING — CT CT RENAL STONE PROTOCOL
2 of 4 series · 16 of 46 positions shown, 18 images · non-contrast
Comparison: None.

CLINICAL DATA: Right flank pain x 2 months, on antibiotics for UTI,
increase in urinary frequency, denies hematuria or dysuria

EXAM:
CT ABDOMEN AND PELVIS WITHOUT CONTRAST
TECHNIQUE: Multidetector CT imaging of the abdomen and pelvis was performed
following the standard protocol without IV contrast.

[Series 2: axial st · axial · 0.95mm/px · z∈[-465,-30]mm · 13 of 95 slices shown, 15 images]
[im 4/95  soft-tissue]
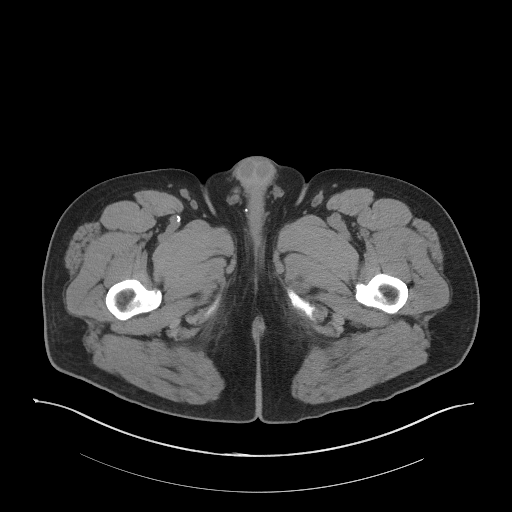
[im 4/95  bone]
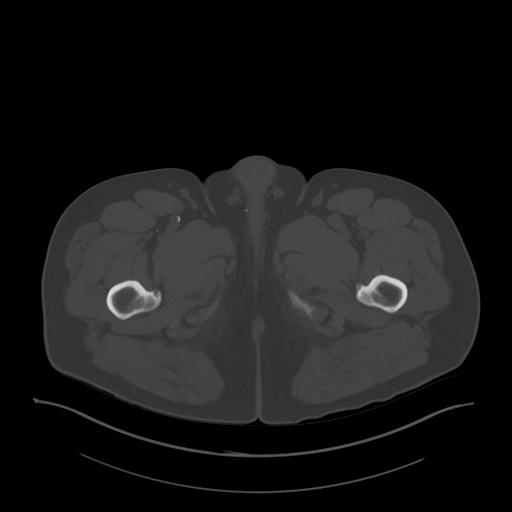
[im 12/95  soft-tissue]
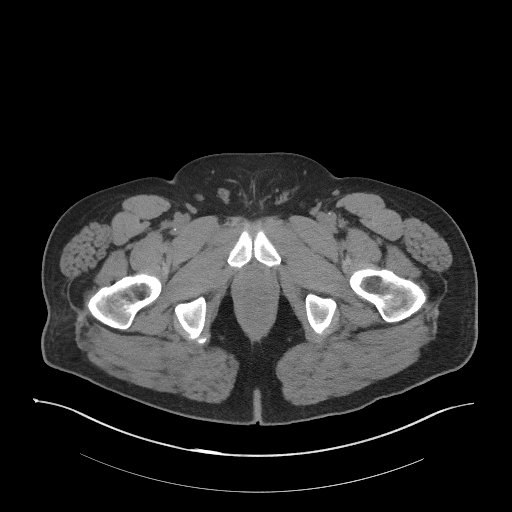
[im 20/95  soft-tissue]
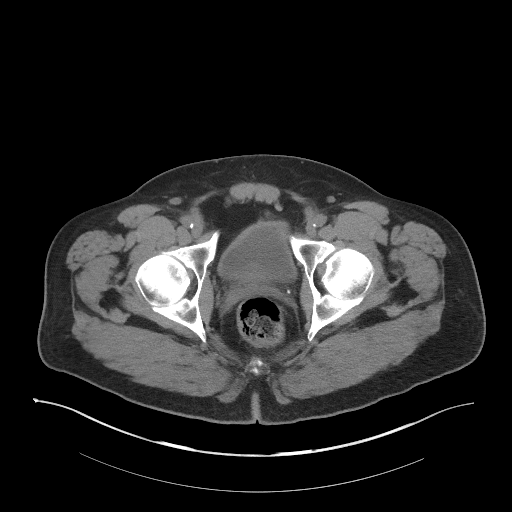
[im 28/95  soft-tissue]
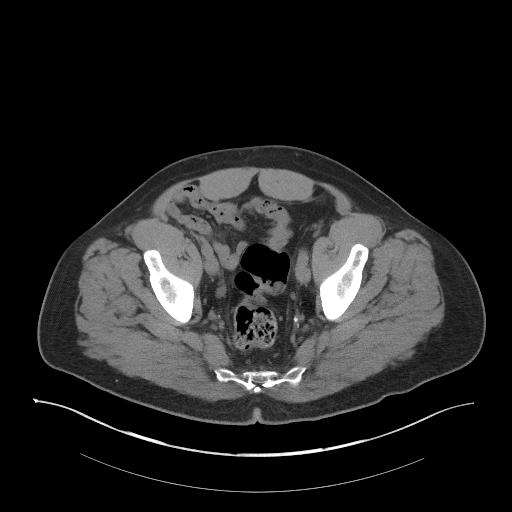
[im 32/95  soft-tissue]
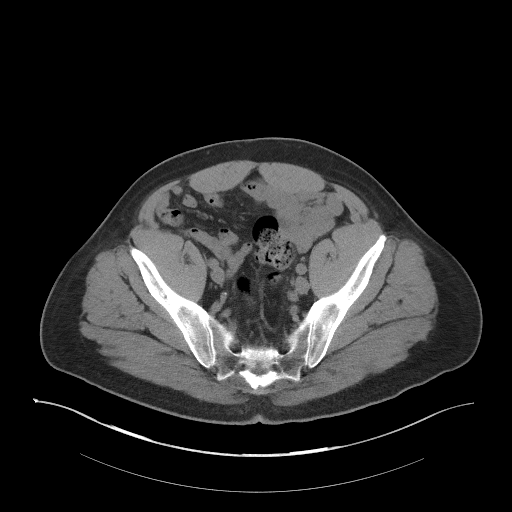
[im 40/95  soft-tissue]
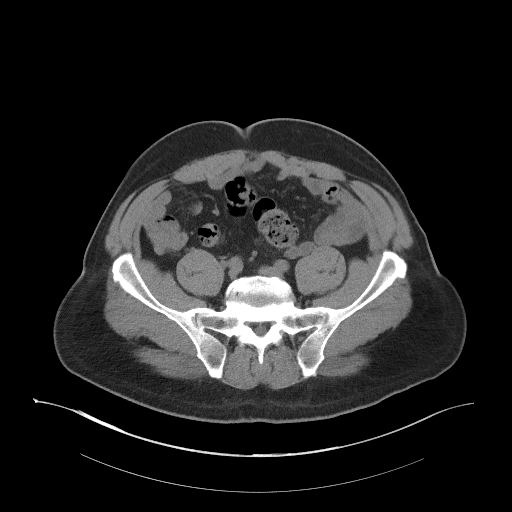
[im 48/95  soft-tissue]
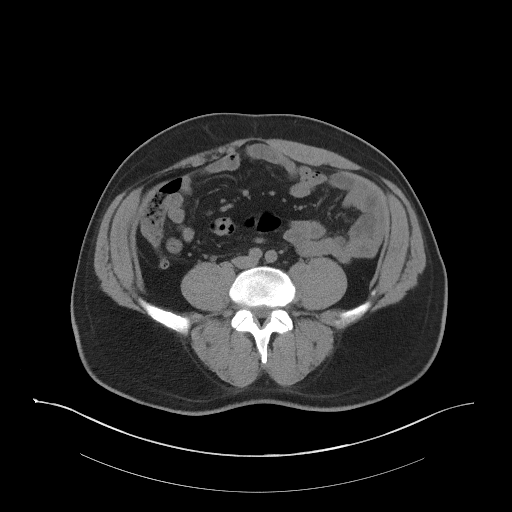
[im 55/95  soft-tissue]
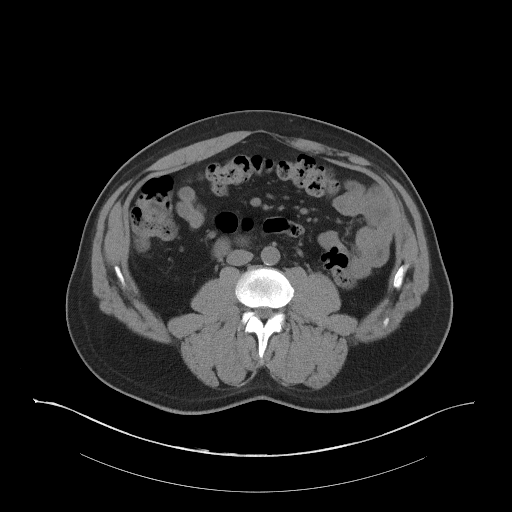
[im 63/95  soft-tissue]
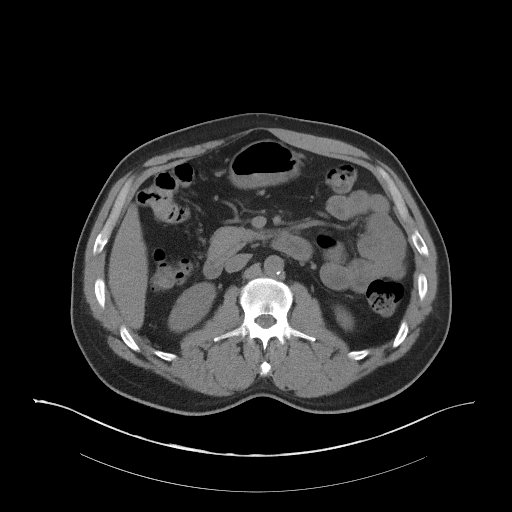
[im 63/95  bone]
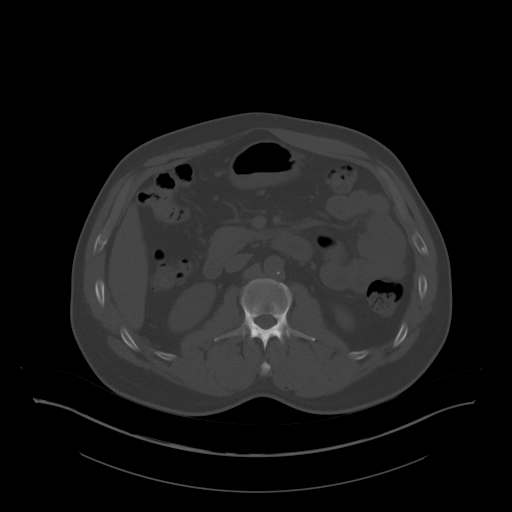
[im 67/95  soft-tissue]
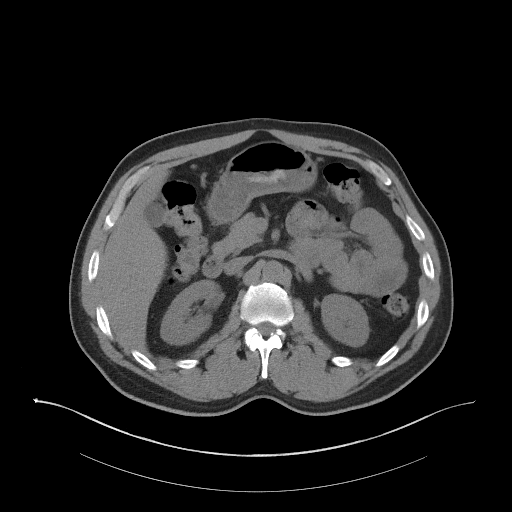
[im 75/95  soft-tissue]
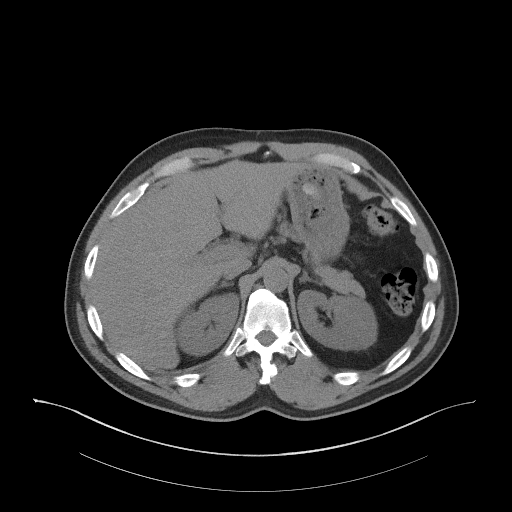
[im 83/95  soft-tissue]
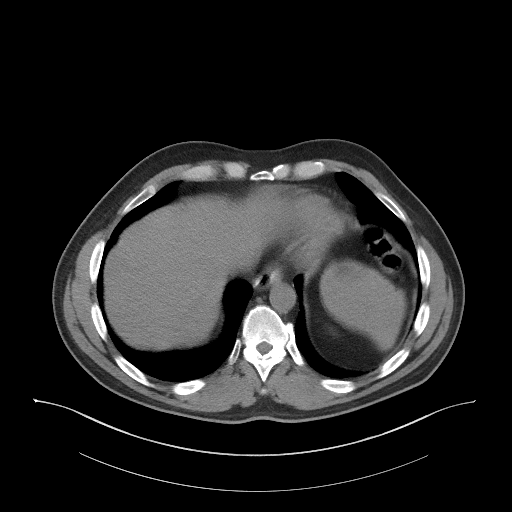
[im 91/95  soft-tissue]
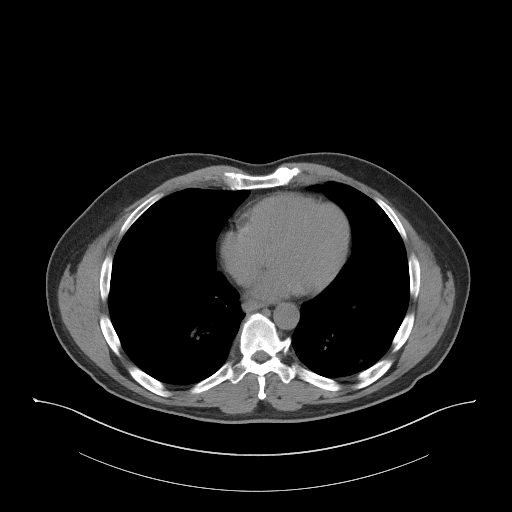

[Series 5: coronal st · coronal · 0.92mm/px · 3 of 98 slices shown]
[im 33/98  soft-tissue]
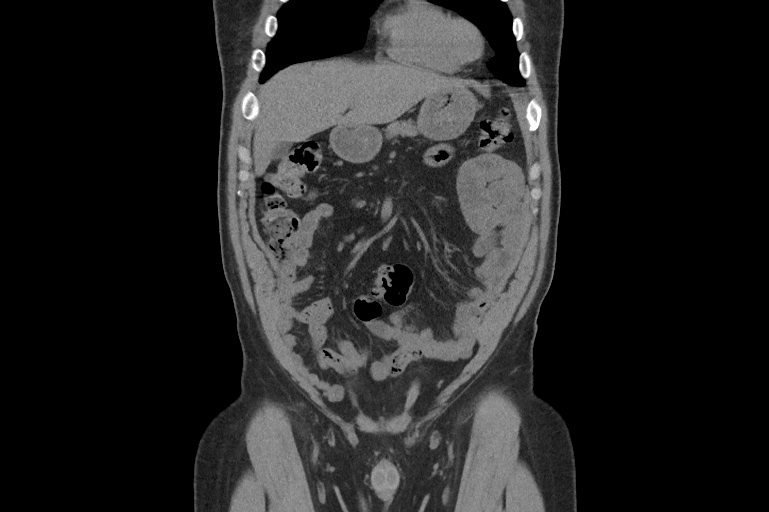
[im 44/98  soft-tissue]
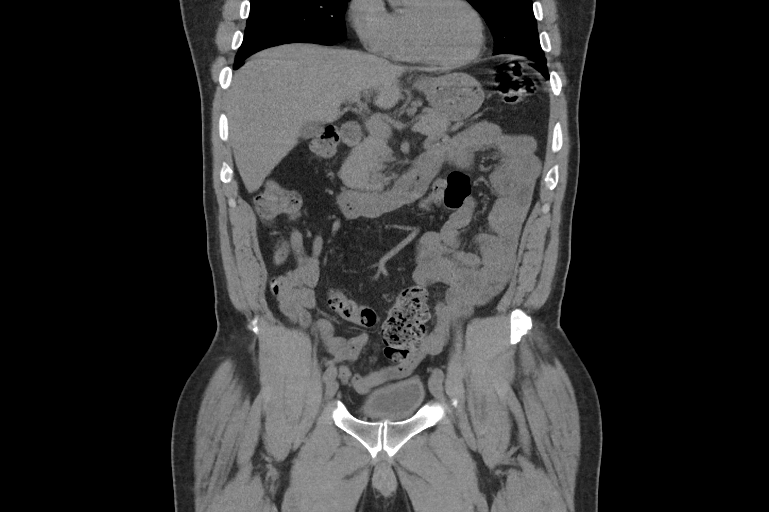
[im 54/98  soft-tissue]
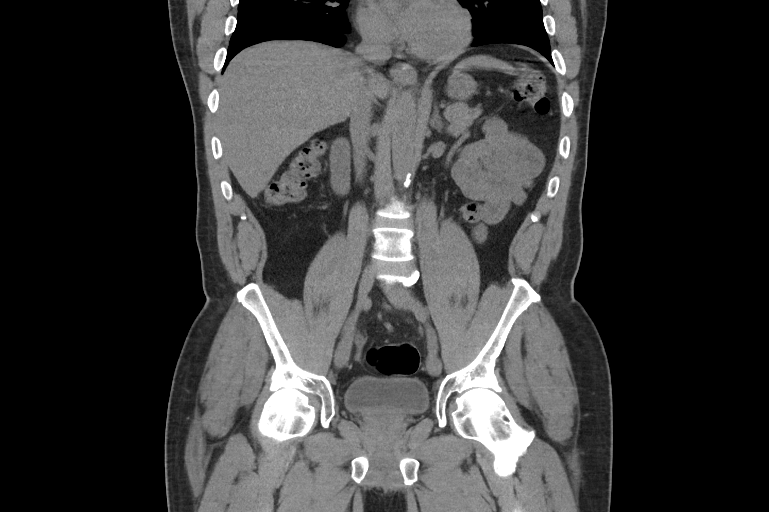

[16 of 46 positions shown; findings below may reference images not displayed]

FINDINGS: Lung bases: Minor left lower lobe subsegmental atelectasis.
Otherwise clear. Heart normal in size. Trace pericardial fluid.

Hepatobiliary: 5 mm low-density right liver lobe lesion likely a
cyst. No other liver abnormality. Normal gallbladder. No bile duct
dilation.

Spleen, pancreas, adrenal glands:  Normal.

Kidneys, ureters, bladder: Tiny nonobstructing stones in the left
kidney. Possible tiny nonobstructing stone in the upper pole the
right kidney. No renal masses. No hydronephrosis. Normal ureters.
Normal bladder.

Lymph nodes:  No adenopathy.

Ascites:  None.

Gastrointestinal: Stomach, small bowel and colon are unremarkable.
Normal appendix visualized.

Musculoskeletal: Mild endplate spurring at T12-L1 and at L5-S1. No
osteoblastic or osteolytic lesions.
IMPRESSION: 1. No acute findings within the abdomen pelvis. No findings to
explain the patient's symptoms.
2. Tiny nonobstructing left intrarenal stones and possible tiny
nonobstructing upper pole right intrarenal stone. No ureteral stone
or obstructive uropathy.
3. Normal appendix visualized.
